# Patient Record
Sex: Female | Born: 1940 | Race: White | Hispanic: No | Marital: Married | Smoking: Never smoker
Health system: Southern US, Community
[De-identification: ages and names within clinical notes are randomized; demographics above are authoritative.]

## PROBLEM LIST (undated history)

## (undated) DIAGNOSIS — K635 Polyp of colon: Secondary | ICD-10-CM

## (undated) DIAGNOSIS — A048 Other specified bacterial intestinal infections: Secondary | ICD-10-CM

## (undated) DIAGNOSIS — K5790 Diverticulosis of intestine, part unspecified, without perforation or abscess without bleeding: Secondary | ICD-10-CM

## (undated) DIAGNOSIS — D649 Anemia, unspecified: Secondary | ICD-10-CM

## (undated) DIAGNOSIS — J189 Pneumonia, unspecified organism: Secondary | ICD-10-CM

## (undated) DIAGNOSIS — I10 Essential (primary) hypertension: Secondary | ICD-10-CM

## (undated) HISTORY — PX: FINGER SURGERY: SHX640

## (undated) HISTORY — DX: Pneumonia, unspecified organism: J18.9

## (undated) HISTORY — PX: CATARACT EXTRACTION: SUR2

## (undated) HISTORY — PX: CORNEAL TRANSPLANT: SHX108

## (undated) HISTORY — DX: Anemia, unspecified: D64.9

## (undated) HISTORY — DX: Polyp of colon: K63.5

## (undated) HISTORY — DX: Other specified bacterial intestinal infections: A04.8

## (undated) HISTORY — DX: Diverticulosis of intestine, part unspecified, without perforation or abscess without bleeding: K57.90

---

## 2012-06-19 HISTORY — PX: GALLBLADDER SURGERY: SHX652

## 2014-06-19 DIAGNOSIS — K635 Polyp of colon: Secondary | ICD-10-CM

## 2014-06-19 HISTORY — DX: Polyp of colon: K63.5

## 2016-06-06 ENCOUNTER — Encounter: Payer: Self-pay | Admitting: Physician Assistant

## 2016-06-22 ENCOUNTER — Ambulatory Visit (INDEPENDENT_AMBULATORY_CARE_PROVIDER_SITE_OTHER): Payer: Medicare Other | Admitting: Physician Assistant

## 2016-06-22 ENCOUNTER — Encounter: Payer: Self-pay | Admitting: Physician Assistant

## 2016-06-22 ENCOUNTER — Encounter (INDEPENDENT_AMBULATORY_CARE_PROVIDER_SITE_OTHER): Payer: Self-pay

## 2016-06-22 VITALS — BP 128/84 | HR 74 | Ht 61.0 in | Wt 152.0 lb

## 2016-06-22 DIAGNOSIS — R140 Abdominal distension (gaseous): Secondary | ICD-10-CM

## 2016-06-22 DIAGNOSIS — R1084 Generalized abdominal pain: Secondary | ICD-10-CM

## 2016-06-22 DIAGNOSIS — R635 Abnormal weight gain: Secondary | ICD-10-CM

## 2016-06-22 DIAGNOSIS — R152 Fecal urgency: Secondary | ICD-10-CM | POA: Diagnosis not present

## 2016-06-22 DIAGNOSIS — R194 Change in bowel habit: Secondary | ICD-10-CM | POA: Diagnosis not present

## 2016-06-22 MED ORDER — DICYCLOMINE HCL 10 MG PO CAPS: ORAL_CAPSULE | ORAL | 0 refills | Status: AC

## 2016-06-22 NOTE — Patient Instructions (Addendum)
We sent a prescription for Bentyl 10 mg to your pharmacy. [facility], [address]  1. Bentyl ( Dicyclomine) 10 mg.   You have been scheduled for a CT scan of the abdomen and pelvis at [facility] ([address]---this is in the same building as Press photographer).   You are scheduled on 06-28-2016 at 1:00 PM. You should arrive at 12:45 PM to your appointment time for registration. Please follow the written instructions below on the day of your exam:  WARNING: IF YOU ARE ALLERGIC TO IODINE/X-RAY DYE, PLEASE NOTIFY RADIOLOGY IMMEDIATELY AT [phone]! YOU WILL BE GIVEN A 13 HOUR PREMEDICATION PREP.  1) Do not eat or drink anything after 9:00 am (4 hours prior to your test) 2) You have been given 2 bottles of oral contrast to drink. The solution may taste  better if refrigerated, but do NOT add ice or any other liquid to this solution. Shake well before drinking.    Drink 1 bottle of contrast @ 11:00 am (2 hours prior to your exam)  Drink 1 bottle of contrast @ 12:00 Noon (1 hour prior to your exam)  You may take any medications as prescribed with a small amount of water except for the following: Metformin, Glucophage, Glucovance, Avandamet, Riomet, Fortamet, Actoplus Met, Janumet, Glumetza or Metaglip. The above medications must be held the day of the exam AND 48 hours after the exam.  The purpose of you drinking the oral contrast is to aid in the visualization of your intestinal tract. The contrast solution may cause some diarrhea. Before your exam is started, you will be given a small amount of fluid to drink. Depending on your individual set of symptoms, you may also receive an intravenous injection of x-ray contrast/dye. Plan on being at [facility] for 30 minutes or long, depending on the type of exam you are having performed.  If you have any questions regarding your exam or if you need to reschedule, you may call the CT department at [phone]  between the hours of 8:00 am and 5:00 pm, Monday-Friday.  ________________________________________________________________________

## 2016-06-22 NOTE — Progress Notes (Addendum)
Subjective:    Patient ID: Sharon Hale, female    DOB: [birth year], 76 y.o.   MRN: [id number]  HPI Sharon Hale is a pleasant 76 year old white female, new to GI today referred by Toy Baker NP/ [facility] regional physicians family medicine for evaluation of complaints of abdominal pain cramping and urgency for bowel movements. Patient is status post cholecystectomy, C-section 2, has history of diverticulosis and prior colon polyps. She relates previous GI evaluations done in [facility] through [facility]. She last had colonoscopy in February 2016. This report is visible in care everywhere and showed left colon diverticulosis and one 4 mm polyp was removed from the ascending colon. I cannot find a path report but patient has with her path report from a prior colonoscopy showing history of adenomatous colon polyps in 2009. She also relates that she had previous testing a few years back , with breast test breath testing to rule out small intestinal bacterial overgrowth and fructose intolerance. These were negative Patient is also concerned about a weight gain of approximately 10 pounds over the past year. She says her appetite has been very good. Her current set GI symptoms started about 2 months ago. She had gone to her primary care had blood work done which included did testing for H. pylori which was positive. She did receive a 2 week treatment course, and also started a probiotic. Continued to have complaints of rather generalized abdominal bloating and discomfort as well as intermittent abdominal cramping. She says she tends to have bouts of constipation which she's had for a couple of years and then occasional loose to normal stools. She says over the past couple of months she's been having urgency for bowel movements particularly in the morning and may have 3-4 separate bowel movements passing soft stools. She has not noted any melena or hematochezia. Despite having frequent bowel movements she says  she always feels bloated.  No recent new medications or supplements etc. She did not feel particularly any different after being treated for H. pylori.  Review of Systems .Pertinent positive and negative review of systems were noted in the above HPI section.  All other review of systems was otherwise negative.  Outpatient Encounter Prescriptions as of 06/22/2016  Medication Sig  . aspirin EC 81 MG tablet Take 81 mg by mouth daily.  . lansoprazole (PREVACID) 30 MG capsule Take 30 mg by mouth 2 (two) times daily before a meal.  . Multiple Vitamin (MULTIVITAMIN) tablet Take 1 tablet by mouth daily.  . Probiotic Product (DIGESTIVE ADVANTAGE PO) Take by mouth.  . dicyclomine (BENTYL) 10 MG capsule Take 1 tab twice daily as needed for abdominal cramping and urgency.   No facility-administered encounter medications on file as of 06/22/2016.    Allergies  Allergen Reactions  . Penicillins    There are no active problems to display for this patient.  Social History   Social History  . Marital status: Married    Spouse name: N/A  . Number of children: N/A  . Years of education: N/A   Occupational History  . retired Runner, broadcasting/film/video    Social History Main Topics  . Smoking status: Never Smoker  . Smokeless tobacco: Never Used  . Alcohol use No  . Drug use: No  . Sexual activity: Not on file   Other Topics Concern  . Not on file   Social History Narrative  . No narrative on file    Sharon Hale's family history includes Heart failure in her  father.      Objective:    Vitals:   06/22/16 0921  BP: 128/84  Pulse: 74    Physical Exam  well-developed older white female in no acute distress, pleasant blood pressure 128/84 pulse 74, Height 5 foot 1, weight 152, BMI 28.7. HEENT; nontraumatic normocephalic EOMI PERRLA sclera anicteric, Cardiovascular ;regular rate and rhythm with S1-S2 no murmur or gallop, Pulmonary ;clear bilaterally, Abdomen ;soft nondistended no palpable mass or  hepatosplenomegaly there is no focal tenderness no guarding or rebound bowel sounds are present, Rectal ;exam not done, Ext;no clubbing cyanosis or edema skin warm and dry, Neuropsych; mood and affect appropriate       Assessment & Plan:   #451 76 year old white female with 2-3 month history of persistent abdominal bloating intermittent abdominal cramping and discomfort with frequent urgency for bowel movements. Previous colonoscopy done February 2016 [facility] with finding of one 4 mm: Polyp and left colon diverticulosis. 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 Patient has  signed a release so we would may retain [facility] Patient will be established with Dr. Myrtie Neitheranis.  Addendum; [facility]Dr. Benjie KarvonenSeth Kaplan; breath testing for SIBO negative February 2016, breath testing for fructose malabsorption not interpretable was to be repeated.  Colonoscopy February 2016/Dr. Benjie KarvonenSeth Kaplan one 4 mm sessile polyp in the ascending colon, internal hemorrhoids normal mucosa in the entire colon and terminal ileum, diverticulosis sigmoid to descending colon. Path  showed normal terminal ileum, right colon no chronic or active colitis ,ascending colon 2 fragments of an adenomatous polyp no high-grade dysplasia ,and left colon normal colonic  mucosa        Bartow Zylstra S Manette Doto PA-C 06/22/2016   Cc: No ref. provider found   Thank you for sending this case to me. I have reviewed the entire note, and the outlined plan seems appropriate.

## 2016-06-27 ENCOUNTER — Other Ambulatory Visit: Payer: Medicare Other

## 2016-06-28 ENCOUNTER — Inpatient Hospital Stay: Admission: RE | Admit: 2016-06-28 | Payer: Medicare Other | Source: Ambulatory Visit

## 2016-06-30 ENCOUNTER — Ambulatory Visit (INDEPENDENT_AMBULATORY_CARE_PROVIDER_SITE_OTHER)
Admission: RE | Admit: 2016-06-30 | Discharge: 2016-06-30 | Disposition: A | Discharge status: Home / Self Care | Payer: Medicare Other | Source: Ambulatory Visit | Attending: Physician Assistant | Admitting: Physician Assistant

## 2016-06-30 DIAGNOSIS — R140 Abdominal distension (gaseous): Secondary | ICD-10-CM

## 2016-06-30 DIAGNOSIS — R1084 Generalized abdominal pain: Secondary | ICD-10-CM

## 2016-06-30 DIAGNOSIS — R635 Abnormal weight gain: Secondary | ICD-10-CM

## 2016-06-30 DIAGNOSIS — R194 Change in bowel habit: Secondary | ICD-10-CM

## 2016-06-30 DIAGNOSIS — R152 Fecal urgency: Secondary | ICD-10-CM

## 2016-06-30 MED ORDER — IOPAMIDOL (ISOVUE-300) INJECTION 61%
100.0000 mL | Freq: Once | INTRAVENOUS | Status: AC | PRN
  Administered 2016-06-30: 100 mL via INTRAVENOUS

## 2016-07-10 ENCOUNTER — Telehealth: Payer: Self-pay | Admitting: Physician Assistant

## 2016-07-10 DIAGNOSIS — R948 Abnormal results of function studies of other organs and systems: Secondary | ICD-10-CM

## 2016-07-10 DIAGNOSIS — R9389 Abnormal findings on diagnostic imaging of other specified body structures: Secondary | ICD-10-CM

## 2016-07-10 NOTE — Telephone Encounter (Signed)
You [facility] been [facility] an [facility] on 07[facility]. Your appointment time is 10 . Please arrive 945 am minutes prior to your appointment time for registration purposes. Please make certain not to have anything to eat or drink 4 hours prior to your test. In addition, if you have any metal in your body, have a pacemaker or defibrillator, please be sure to let your ordering physician know. This test typically takes 45 minutes to 1 hour to complete.  Bring card for corneal transplant  Pt has been instructed and verbalized understanding, she was given the number

## 2016-07-11 ENCOUNTER — Encounter: Payer: Self-pay | Admitting: Physician Assistant

## 2016-07-18 ENCOUNTER — Ambulatory Visit (HOSPITAL_COMMUNITY)
Admission: RE | Admit: 2016-07-18 | Discharge: 2016-07-18 | Disposition: A | Discharge status: Home / Self Care | Payer: Medicare Other | Source: Ambulatory Visit | Attending: Physician Assistant | Admitting: Physician Assistant

## 2016-07-18 ENCOUNTER — Other Ambulatory Visit: Payer: Self-pay | Admitting: Physician Assistant

## 2016-07-18 DIAGNOSIS — R9389 Abnormal findings on diagnostic imaging of other specified body structures: Secondary | ICD-10-CM

## 2016-07-18 DIAGNOSIS — R948 Abnormal results of function studies of other organs and systems: Secondary | ICD-10-CM | POA: Diagnosis not present

## 2016-07-18 LAB — POCT I-STAT CREATININE: CREATININE: 0.6 mg/dL (ref 0.44–1.00)

## 2016-07-18 MED ORDER — GADOBENATE DIMEGLUMINE 529 MG/ML IV SOLN
15.0000 mL | Freq: Once | INTRAVENOUS | Status: AC | PRN
  Administered 2016-07-18: 15 mL via INTRAVENOUS

## 2016-09-26 ENCOUNTER — Emergency Department (HOSPITAL_COMMUNITY)
Admission: EM | Admit: 2016-09-26 | Discharge: 2016-09-27 | Disposition: A | Discharge status: Home / Self Care | Payer: Medicare Other | Attending: Emergency Medicine | Admitting: Emergency Medicine

## 2016-09-26 ENCOUNTER — Encounter (HOSPITAL_COMMUNITY): Payer: Self-pay

## 2016-09-26 DIAGNOSIS — Z79899 Other long term (current) drug therapy: Secondary | ICD-10-CM | POA: Diagnosis not present

## 2016-09-26 DIAGNOSIS — R1030 Lower abdominal pain, unspecified: Secondary | ICD-10-CM | POA: Insufficient documentation

## 2016-09-26 DIAGNOSIS — Z7982 Long term (current) use of aspirin: Secondary | ICD-10-CM | POA: Diagnosis not present

## 2016-09-26 DIAGNOSIS — I10 Essential (primary) hypertension: Secondary | ICD-10-CM | POA: Insufficient documentation

## 2016-09-26 DIAGNOSIS — Z8719 Personal history of other diseases of the digestive system: Secondary | ICD-10-CM

## 2016-09-26 HISTORY — DX: Essential (primary) hypertension: I10

## 2016-09-26 LAB — URINALYSIS, ROUTINE W REFLEX MICROSCOPIC
BILIRUBIN URINE: NEGATIVE
GLUCOSE, UA: NEGATIVE mg/dL
Hgb urine dipstick: NEGATIVE
KETONES UR: NEGATIVE mg/dL
LEUKOCYTES UA: NEGATIVE
NITRITE: NEGATIVE
PH: 6 (ref 5.0–8.0)
Protein, ur: NEGATIVE mg/dL
Specific Gravity, Urine: 1.017 (ref 1.005–1.030)

## 2016-09-26 LAB — CBC
HEMATOCRIT: 41.2 % (ref 36.0–46.0)
HEMOGLOBIN: 13.5 g/dL (ref 12.0–15.0)
MCH: 30.3 pg (ref 26.0–34.0)
MCHC: 32.8 g/dL (ref 30.0–36.0)
MCV: 92.6 fL (ref 78.0–100.0)
Platelets: 339 10*3/uL (ref 150–400)
RBC: 4.45 MIL/uL (ref 3.87–5.11)
RDW: 14.4 % (ref 11.5–15.5)
WBC: 12 10*3/uL — ABNORMAL HIGH (ref 4.0–10.5)

## 2016-09-26 LAB — COMPREHENSIVE METABOLIC PANEL
ALK PHOS: 111 U/L (ref 38–126)
ALT: 25 U/L (ref 14–54)
ANION GAP: 9 (ref 5–15)
AST: 34 U/L (ref 15–41)
Albumin: 3.9 g/dL (ref 3.5–5.0)
BILIRUBIN TOTAL: 1 mg/dL (ref 0.3–1.2)
BUN: 21 mg/dL — ABNORMAL HIGH (ref 6–20)
CALCIUM: 9.4 mg/dL (ref 8.9–10.3)
CO2: 26 mmol/L (ref 22–32)
CREATININE: 0.65 mg/dL (ref 0.44–1.00)
Chloride: 103 mmol/L (ref 101–111)
Glucose, Bld: 104 mg/dL — ABNORMAL HIGH (ref 65–99)
Potassium: 4.2 mmol/L (ref 3.5–5.1)
SODIUM: 138 mmol/L (ref 135–145)
TOTAL PROTEIN: 7.2 g/dL (ref 6.5–8.1)

## 2016-09-26 LAB — LIPASE, BLOOD: Lipase: 16 U/L (ref 11–51)

## 2016-09-26 NOTE — ED Triage Notes (Signed)
Pt states she started having abdominal cramps this morning; pt denies any other issues; pt has hx of diverticulosis; pt states pain at 7/10 on arrival. No n/v; pt a&ox 4 on arrival.

## 2016-09-27 ENCOUNTER — Emergency Department (HOSPITAL_COMMUNITY): Payer: Medicare Other

## 2016-09-27 MED ORDER — METRONIDAZOLE 500 MG PO TABS: 500.0000 mg | ORAL_TABLET | Freq: Three times a day (TID) | ORAL | 0 refills | Status: AC

## 2016-09-27 MED ORDER — CIPROFLOXACIN HCL 500 MG PO TABS: 500.0000 mg | ORAL_TABLET | Freq: Two times a day (BID) | ORAL | 0 refills | Status: AC

## 2016-09-27 NOTE — ED Provider Notes (Signed)
[facility]-EMERGENCY DEPT Provider Note   CSN: [id number] Arrival date & time: 09/26/16  1927     History   Chief Complaint Chief Complaint  Patient presents with  . Abdominal Pain  . Abdominal Cramping    HPI Delorise Hunkele is a 76 y.o. female.  Patient presents with complaint of abdominal pain in the lower abdomen that started earlier today. No fever, diarrhea, hematochezia, nausea or vomiting. She was seen at Urgent Care who advised patient to come to the ED for further evaluation, possible CT scan to r/o recurrent diverticulitis. Last episode was "years ago". No urinary symptoms. Since arrival to the emergency room her pain has resolved.    The history is provided by the patient. No language interpreter was used.  Abdominal Pain   Pertinent negatives include fever, nausea, vomiting and myalgias.  Abdominal Cramping  Associated symptoms include abdominal pain. Pertinent negatives include no chest pain and no shortness of breath.    Past Medical History:  Diagnosis Date  . Colon polyps 2016  . Diverticulosis   . H. pylori infection   . Hypertension     There are no active problems to display for this patient.   Past Surgical History:  Procedure Laterality Date  . CATARACT EXTRACTION    . CESAREAN SECTION  1980  . CESAREAN SECTION  1985  . CORNEAL TRANSPLANT    . FINGER SURGERY    . GALLBLADDER SURGERY  2014    OB History    No data available       Home Medications    Prior to Admission medications   Medication Sig Start Date End Date Taking? Authorizing Provider  aspirin EC 81 MG tablet Take 81 mg by mouth daily.   Yes Historical Provider, MD  Multiple Vitamin (MULTIVITAMIN) tablet Take 1 tablet by mouth daily.   Yes Historical Provider, MD  prednisoLONE acetate (PRED FORTE) 1 % ophthalmic suspension Place 1 drop into both eyes daily. 09/04/16  Yes Historical Provider, MD  Probiotic Product (DIGESTIVE ADVANTAGE PO) Take 1 tablet by mouth daily.    Yes  Historical Provider, MD  dicyclomine (BENTYL) 10 MG capsule Take 1 tab twice daily as needed for abdominal cramping and urgency. Patient not taking: Reported on 09/27/2016 06/22/16   Amy S Esterwood, PA-C    Family History Family History  Problem Relation Age of Onset  . Heart failure Father     Social History Social History  Substance Use Topics  . Smoking status: Never Smoker  . Smokeless tobacco: Never Used  . Alcohol use No     Allergies   Azithromycin and Penicillins   Review of Systems Review of Systems  Constitutional: Negative for chills and fever.  Respiratory: Negative.  Negative for shortness of breath.   Cardiovascular: Negative.  Negative for chest pain.  Gastrointestinal: Positive for abdominal pain. Negative for blood in stool, nausea and vomiting.  Musculoskeletal: Negative.  Negative for myalgias.  Skin: Negative.   Neurological: Negative.      Physical Exam Updated Vital Signs BP 117/72 (BP Location: Right Arm)   Pulse 72   Temp 98.6 F (37 C) (Oral)   Resp 18   Ht  (1.549 m)   Wt 67.1 kg   SpO2 95%   BMI 27.96 kg/m   Physical Exam  Constitutional: She is oriented to person, place, and time. She appears well-developed and well-nourished.  HENT:  Head: Normocephalic.  Neck: Normal range of motion. Neck supple.  Cardiovascular: Normal  rate and regular rhythm.   Pulmonary/Chest: Effort normal and breath sounds normal.  Abdominal: Soft. Bowel sounds are normal. There is no tenderness. There is no rebound and no guarding.  Musculoskeletal: Normal range of motion.  Neurological: She is alert and oriented to person, place, and time.  Skin: Skin is warm and dry. No rash noted.  Psychiatric: She has a normal mood and affect.     ED Treatments / Results  Labs (all labs ordered are listed, but only abnormal results are displayed) Labs Reviewed  COMPREHENSIVE METABOLIC PANEL - Abnormal; Notable for the following:       Result Value    Glucose, Bld 104 (*)    BUN 21 (*)    All other components within normal limits  CBC - Abnormal; Notable for the following:    WBC 12.0 (*)    All other components within normal limits  LIPASE, BLOOD  URINALYSIS, ROUTINE W REFLEX MICROSCOPIC   Results for orders placed or performed during the hospital encounter of 09/26/16  Lipase, blood  Result Value Ref Range   Lipase 16 11 - 51 U/L  Comprehensive metabolic panel  Result Value Ref Range   Sodium 138 135 - 145 mmol/L   Potassium 4.2 3.5 - 5.1 mmol/L   Chloride 103 101 - 111 mmol/L   CO2 26 22 - 32 mmol/L   Glucose, Bld 104 (H) 65 - 99 mg/dL   BUN 21 (H) 6 - 20 mg/dL   Creatinine, Ser 0.98 0.44 - 1.00 mg/dL   Calcium 9.4 8.9 - 11.9 mg/dL   Total Protein 7.2 6.5 - 8.1 g/dL   Albumin 3.9 3.5 - 5.0 g/dL   AST 34 15 - 41 U/L   ALT 25 14 - 54 U/L   Alkaline Phosphatase 111 38 - 126 U/L   Total Bilirubin 1.0 0.3 - 1.2 mg/dL   GFR calc non Af Amer >60 >60 mL/min   GFR calc Af Amer >60 >60 mL/min   Anion gap 9 5 - 15  CBC  Result Value Ref Range   WBC 12.0 (H) 4.0 - 10.5 K/uL   RBC 4.45 3.87 - 5.11 MIL/uL   Hemoglobin 13.5 12.0 - 15.0 g/dL   HCT 14.7 82.9 - 56.2 %   MCV 92.6 78.0 - 100.0 fL   MCH 30.3 26.0 - 34.0 pg   MCHC 32.8 30.0 - 36.0 g/dL   RDW 13.0 86.5 - 78.4 %   Platelets 339 150 - 400 K/uL  Urinalysis, Routine w reflex microscopic  Result Value Ref Range   Color, Urine YELLOW YELLOW   APPearance CLEAR CLEAR   Specific Gravity, Urine 1.017 1.005 - 1.030   pH 6.0 5.0 - 8.0   Glucose, UA NEGATIVE NEGATIVE mg/dL   Hgb urine dipstick NEGATIVE NEGATIVE   Bilirubin Urine NEGATIVE NEGATIVE   Ketones, ur NEGATIVE NEGATIVE mg/dL   Protein, ur NEGATIVE NEGATIVE mg/dL   Nitrite NEGATIVE NEGATIVE   Leukocytes, UA NEGATIVE NEGATIVE    EKG  EKG Interpretation None       Radiology Dg Abdomen Acute W/chest  Result Date: 09/27/2016 CLINICAL DATA:  Acute onset of generalized abdominal pain. Initial encounter. EXAM:  DG ABDOMEN ACUTE W/ 1V CHEST COMPARISON:  CT of the abdomen and pelvis performed 06/30/2016 FINDINGS: The lungs are well-aerated. Mild vascular congestion is noted. Peribronchial thickening is seen. Mild left basilar atelectasis is noted. There is no evidence of pleural effusion or pneumothorax. The cardiomediastinal silhouette is within normal limits. The  visualized bowel gas pattern is unremarkable. Scattered stool and air are seen within the colon; there is no evidence of small bowel dilatation to suggest obstruction. No free intra-abdominal air is identified on the provided upright view. No acute osseous abnormalities are seen; the sacroiliac joints are unremarkable in appearance. Left convex thoracolumbar scoliosis is noted. Clips are noted within the right upper quadrant, reflecting prior cholecystectomy. IMPRESSION: 1. Unremarkable bowel gas pattern; no free intra-abdominal air seen. Small amount of stool noted in the colon. 2. Mild vascular congestion. Peribronchial thickening. Mild left basilar atelectasis noted. 3. Left convex thoracolumbar scoliosis noted. Electronically Signed   By: Roanna Raider M.D.   On: 09/27/2016 00:54    Procedures Procedures (including critical care time)  Medications Ordered in ED Medications - No data to display   Initial Impression / Assessment and Plan / ED Course  I have reviewed the triage vital signs and the nursing notes.  Pertinent labs & imaging results that were available during my care of the patient were reviewed by me and considered in my medical decision making (see chart for details).     Patient presents with abdominal pain since this morning, resolved since arrival. Labs reassuring. Her exam is benign with nontender abdomen.   She is evaluated by Dr. Wilkie Aye who agrees with exam. Will obtain abdominal series, but anticipate discharge home on empiric treatment for mild diverticulitis.  Final Clinical Impressions(s) / ED Diagnoses   Final  diagnoses:  None   1. Abdominal pain  New Prescriptions New Prescriptions   No medications on file     Elpidio Anis, Cordelia Poche 09/27/16 0131    Shon Baton, MD 09/30/16 1815

## 2018-04-26 IMAGING — CT CT ABD-PELV W/ CM
2 of 5 series · 15 of 46 positions shown, 17 images · IV contrast ([address]OVUE 300)
Comparison: None.

CLINICAL DATA: Diffuse abdominal pain and distension. Intermittent
diarrhea and weight gain over the past 2-4 months. History of
diverticulosis.

EXAM:
CT ABDOMEN AND PELVIS WITH CONTRAST
TECHNIQUE: Multidetector CT imaging of the abdomen and pelvis was performed
using the standard protocol following bolus administration of
intravenous contrast.
CONTRAST:  100mL [id number] IOPAMIDOL ([id number]-[id number]) INJECTION 61%

[Series 2: abd/pel w · axial · 0.67mm/px · z∈[-454,-69]mm · 12 of 87 slices shown, 14 images]
[im 5/87  soft-tissue]
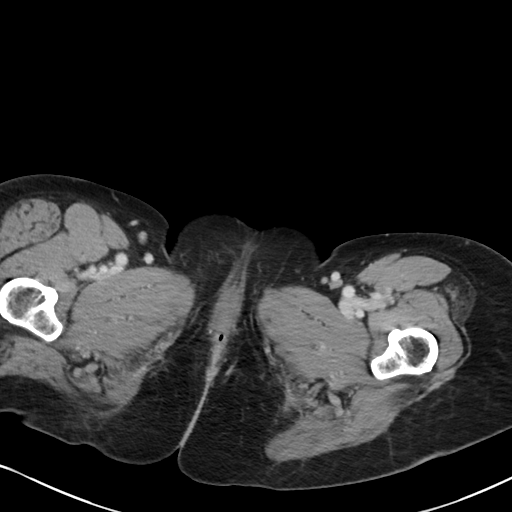
[im 5/87  bone]
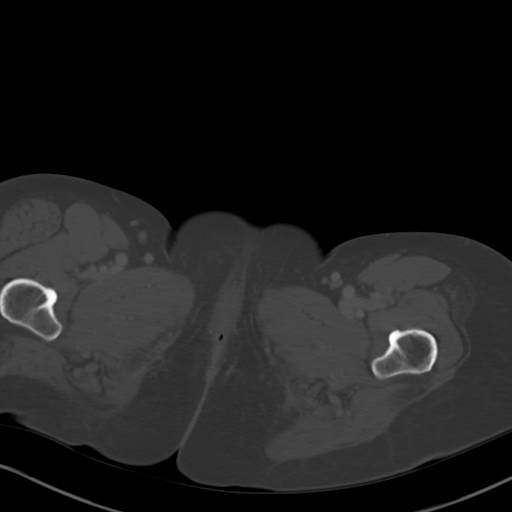
[im 14/87  soft-tissue]
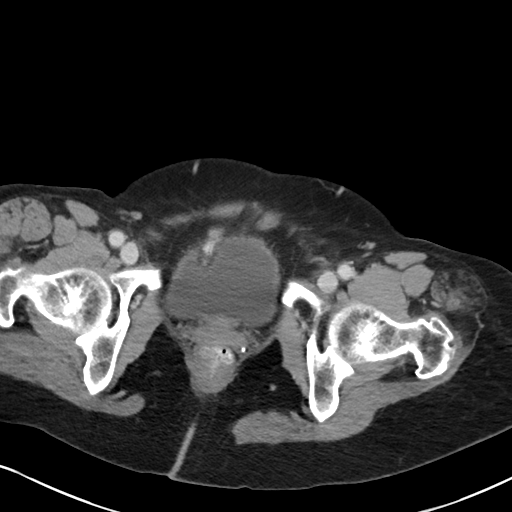
[im 19/87  soft-tissue]
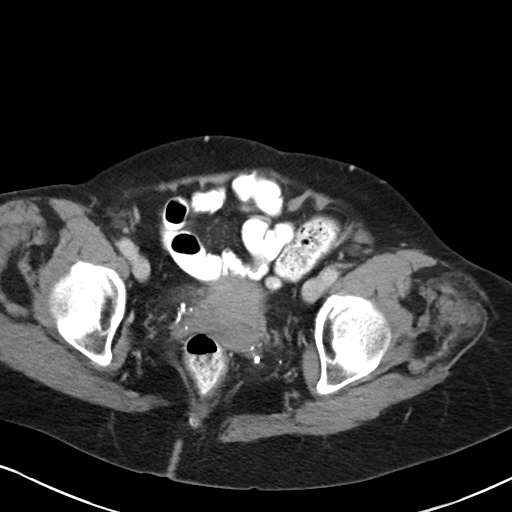
[im 28/87  soft-tissue]
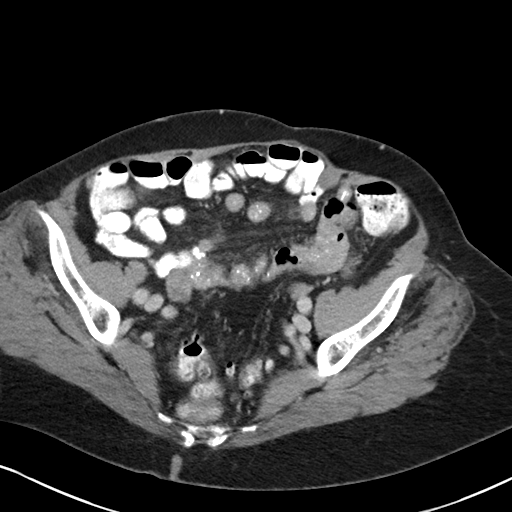
[im 32/87  soft-tissue]
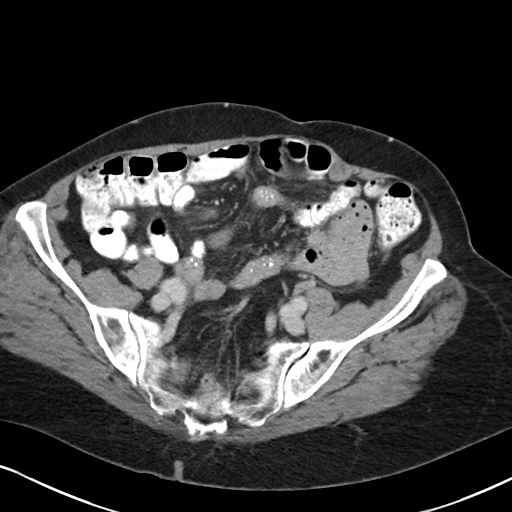
[im 41/87  soft-tissue]
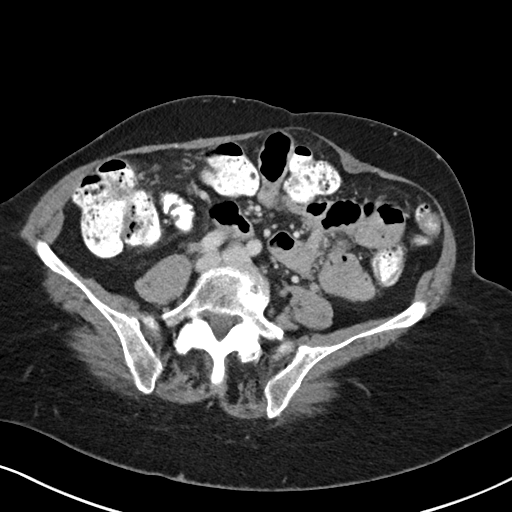
[im 46/87  soft-tissue]
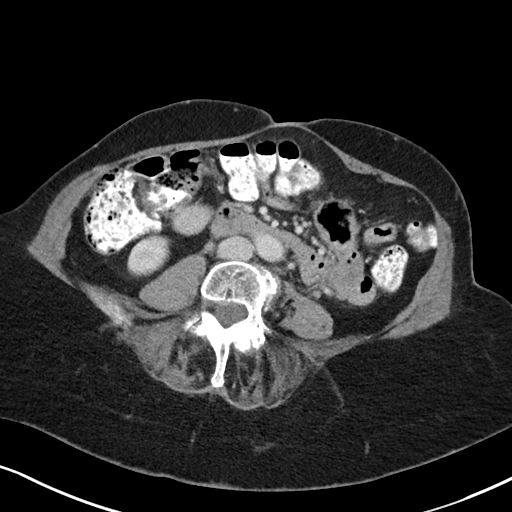
[im 55/87  soft-tissue]
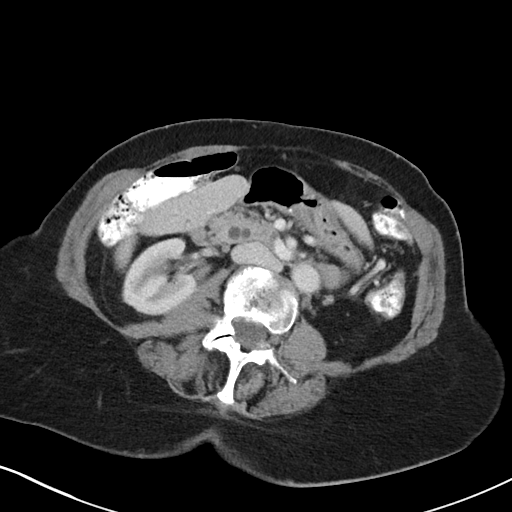
[im 59/87  soft-tissue]
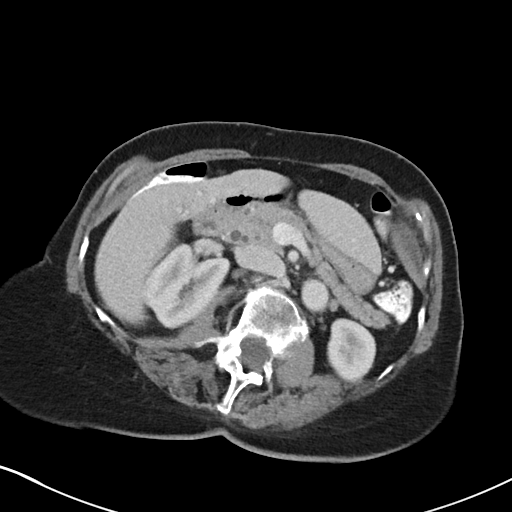
[im 59/87  bone]
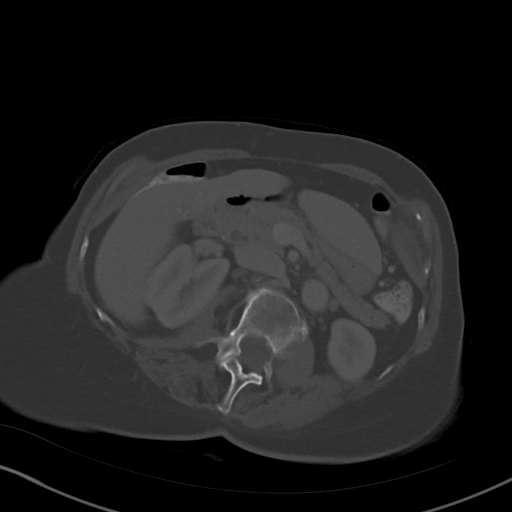
[im 68/87  soft-tissue]
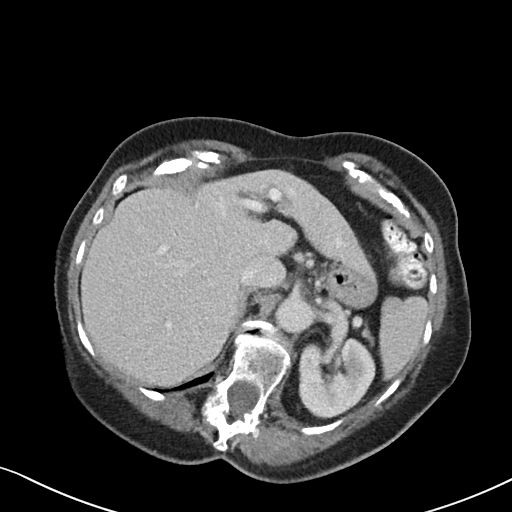
[im 73/87  soft-tissue]
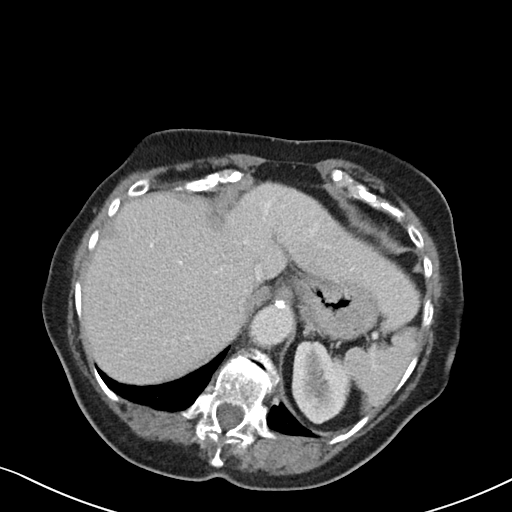
[im 82/87  soft-tissue]
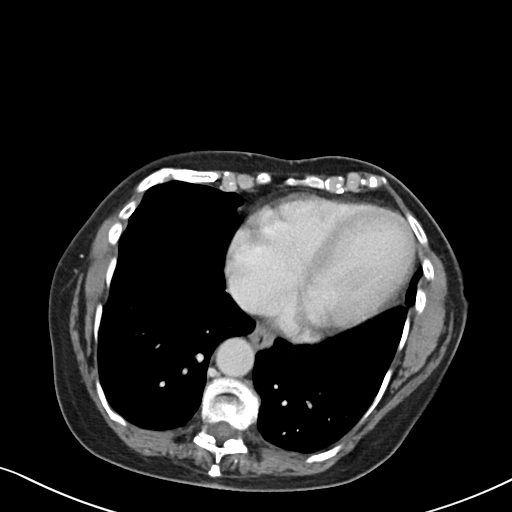

[Series 6: abd/pel w st · coronal · 0.65mm/px · 3 of 83 slices shown]
[im 28/83  soft-tissue]
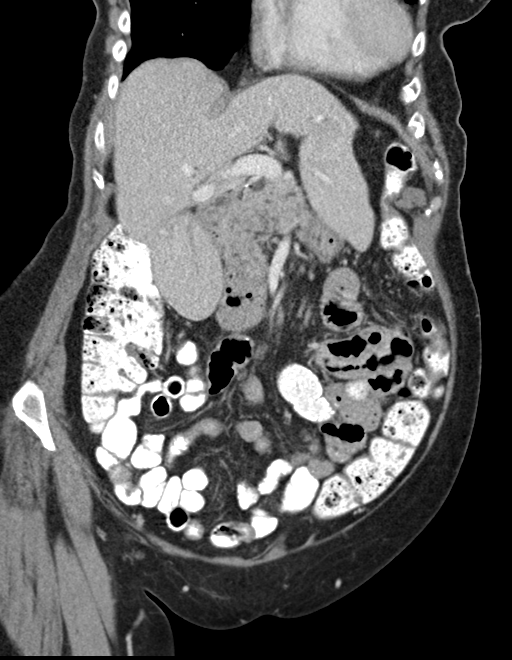
[im 37/83  soft-tissue]
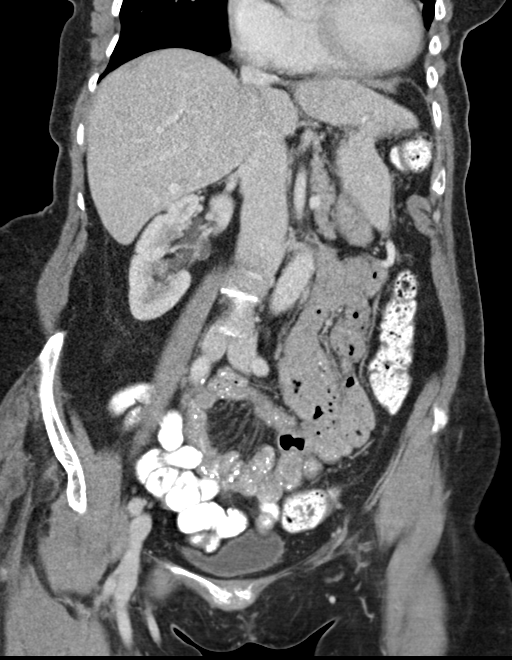
[im 46/83  soft-tissue]
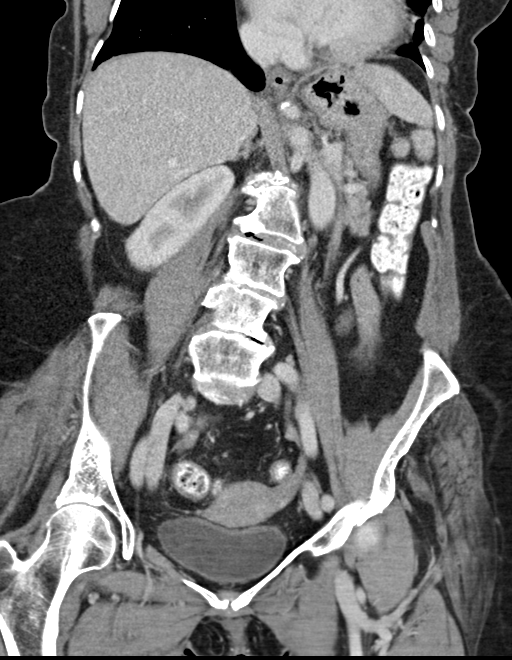

[15 of 46 positions shown; findings below may reference images not displayed]

FINDINGS: Lower chest: Mild linear atelectasis or scarring at the left lung
base.

Hepatobiliary: Small liver cysts. Mildly dilated bile ducts in the
left lobe of the liver, within normal limits for a patient has had a
cholecystectomy. Small calcified granuloma in the dome of the liver
on the right. Cholecystectomy clips.

Pancreas: Dilated pancreatic duct, most pronounced in the inferior
head of the pancreas, with a maximum diameter of 7 mm. The duct more
distally is minimally dilated multiple small, dilated duct branches
in the head of the pancreas. No visible mass. The common duct in the
head of the pancreas measures 7.3 mm in maximum diameter, within
normal limits following a cholecystectomy.

Spleen: Normal in size without focal abnormality.

Adrenals/Urinary Tract: Adrenal glands are unremarkable. Kidneys are
normal, without renal calculi, focal lesion, or hydronephrosis.
Bladder is unremarkable.

Stomach/Bowel: Diffuse proximal gastric wall thickening with a
maximum thickness of 1.5 cm. The stomach is poorly distended.
Multiple colonic diverticula. Unremarkable small bowel. No evidence
of appendicitis. Scratch of the cecum instructed superiorly. Normal
appearing appendix in the right upper abdomen.

Vascular/Lymphatic: Mild atheromatous arterial calcifications,
including the abdominal aorta. No enlarged lymph nodes.

Reproductive: Uterus and bilateral adnexa are unremarkable.

Other: None.

Musculoskeletal: Thoracolumbar spine scoliosis and degenerative
changes.
IMPRESSION: 1. Dilated pancreatic duct in branches in the head of the pancreas.
This could be due to a small, nonvisualized obstructing mass or
stone.
2. Diffuse proximal gastric wall thickening. This is most likely
artifactual due to under distention of the stomach. An infiltrate
gastric mass, symptoms lymphoma, is less likely. If this is a
clinical concern, further evaluation with upper endoscopy would be
recommended.
3. Mild aortic atherosclerosis.

## 2020-08-17 ENCOUNTER — Telehealth: Payer: Self-pay | Admitting: Physician Assistant

## 2020-08-17 NOTE — Telephone Encounter (Signed)
Hi Sharon Hale, this pt called looking to schedule her colonoscopy, her last one was in 2016 and she stated that she is due this year. Colon report is in Epic and path results are in "Care everywhere" under "Other results" tab dated 07/22/2014. Please advise if it is ok to schedule pt for direct colon. Thank you.

## 2020-08-18 NOTE — Telephone Encounter (Signed)
She hs hx of adenomatous polyps, was told 5 year follow up - Ok to schedule as direct - I established her with Dr Myrtie Neither - when I saw her in 2020

## 2020-08-19 ENCOUNTER — Telehealth: Payer: Self-pay

## 2020-08-19 NOTE — Telephone Encounter (Signed)
Yes. - HD

## 2020-08-19 NOTE — Telephone Encounter (Signed)
Pt advise of recommendation and she is requesting Dr. Christella Hartigan. A message will be sent to both MDs.

## 2020-08-19 NOTE — Telephone Encounter (Signed)
Hi Dr. Myrtie Neither, this pt would like to have her colonoscopy performed by Dr. Christella Hartigan. She was assigned to you when she saw Amy and you were the supervising physician. Are you ok releasing pt? Thank you

## 2020-08-20 NOTE — Telephone Encounter (Signed)
Dr. Christella Hartigan, I informed Sharon Hale of your recommendation and she was fine with that. I entered a recall for February 2023 to contact Sharon Hale to schedule. Thank you.

## 2020-08-20 NOTE — Telephone Encounter (Signed)
I am happy to see her however I am not sure that she really needs a colonoscopy at this point.  Guidelines have changed.  Her 2016 colonoscopy in [place] found a single subcentimeter adenomatous polyp.    She needs recall colonoscopy 7 years from the time of that one which would be February 2023.  I am happy to discuss this further with her if she prefers, in that case offer new GI office visit my next available.

## 2020-10-28 ENCOUNTER — Encounter: Payer: Medicare PPO | Admitting: Gastroenterology

## 2021-08-25 ENCOUNTER — Encounter: Payer: Self-pay | Admitting: Gastroenterology

## 2021-09-02 ENCOUNTER — Encounter: Payer: Self-pay | Admitting: Gastroenterology

## 2021-10-05 ENCOUNTER — Encounter: Payer: Self-pay | Admitting: Gastroenterology

## 2021-10-05 ENCOUNTER — Ambulatory Visit: Payer: Medicare PPO | Admitting: Gastroenterology

## 2021-10-05 VITALS — BP 132/82 | HR 76 | Resp 94 | Ht 61.0 in | Wt 123.0 lb

## 2021-10-05 DIAGNOSIS — Z86010 Personal history of colonic polyps: Secondary | ICD-10-CM

## 2021-10-05 NOTE — Patient Instructions (Signed)
If you are age 78 or older, your body mass index should be between 23-30. Your Body mass index is 23.24 kg/m?Marland Kitchen If this is out of the aforementioned range listed, please consider follow up with your Primary Care Provider. ?________________________________________________________ ? ?The [facility] providers would like to encourage you to use [facility] to communicate with providers for non-urgent requests or questions.  Due to long hold times on the telephone, sending your provider a message by [facility] may be a faster and more efficient way to get a response.  Please allow 48 business hours for a response.  Please remember that this is for non-urgent requests.  ?_______________________________________________________ ? ?You have been scheduled for a colonoscopy. Please follow written instructions given to you at your visit today.  ?Please pick up your prep supplies at the pharmacy within the next 1-3 days. ?If you use inhalers (even only as needed), please bring them with you on the day of your procedure. ? ?Due to recent changes in healthcare laws, you may see the results of your imaging and laboratory studies on MyChart before your provider has had a chance to review them.  We understand that in some cases there may be results that are confusing or concerning to you. Not all laboratory results come back in the same time frame and the provider may be waiting for multiple results in order to interpret others.  Please give [place] 48 hours in order for your provider to thoroughly review all the results before contacting the office for clarification of your results.  ? ?Thank you for entrusting me with your care and choosing [facility]. ? ?Dr Christella Hartigan ? ?

## 2021-10-05 NOTE — Progress Notes (Signed)
Review of pertinent gastrointestinal problems: ?1. Precancerous colon polyps: had colonoscopy in [place] in February 2016 which showed left colon diverticulosis and one 4 mm polyp was removed from the ascending colon. Path was TA without HGD.  Also a report from a prior colonoscopy showing history of adenomatous colon polyps in 2009. ? ?HPI: ?This is a very pleasant 81 year old woman who was referred to me by Ilona Sorrel, NP  to evaluate history of colon polyps.   ? ?She has mild chronic IBS-like symptoms that seem to improve when she takes fiber supplement every day. ? ?No overt GI bleeding ? ?She is bothered by mild chronic lower abdominal distention. ? ?No serious abdominal pains ? ?No family history of colon cancer ? ?Old Data Reviewed: ? ?Blood work 07/2021 CBC and complete metabolic profile were normal most ? ? ? ?Review of systems: ?Pertinent positive and negative review of systems were noted in the above HPI section. All other review negative. ? ? ?Past Medical History:  ?Diagnosis Date  ? Anemia   ? Colon polyps 2016  ? Diverticulosis   ? H. pylori infection   ? Hypertension   ? Pneumonia   ? ? ?Past Surgical History:  ?Procedure Laterality Date  ? CATARACT EXTRACTION    ? CESAREAN SECTION  1980  ? CESAREAN SECTION  1985  ? CORNEAL TRANSPLANT    ? FINGER SURGERY    ? GALLBLADDER SURGERY  2014  ? ? ?Current Outpatient Medications  ?Medication Instructions  ? aspirin EC 81 mg, Oral, Daily  ? ciprofloxacin (CIPRO) 500 mg, Oral, Every 12 hours  ? dicyclomine (BENTYL) 10 MG capsule Take 1 tab twice daily as needed for abdominal cramping and urgency.  ? metroNIDAZOLE (FLAGYL) 500 mg, Oral, 3 times daily  ? Multiple Vitamin (MULTIVITAMIN) tablet 1 tablet, Oral, Daily  ? prednisoLONE acetate (PRED FORTE) 1 % ophthalmic suspension 1 drop, Both Eyes, Daily  ? Probiotic Product (DIGESTIVE ADVANTAGE PO) 1 tablet, Oral, Daily  ? ? ?Allergies as of 10/05/2021 - Review Complete 10/05/2021  ?Allergen Reaction Noted  ?  Azithromycin Hives 05/05/2015  ? Penicillins Hives 05/05/2015  ? ? ?Family History  ?Problem Relation Age of Onset  ? Heart failure Father   ? ? ?Social History  ? ?Socioeconomic History  ? Marital status: Married  ?  Spouse name: Not on file  ? Number of children: Not on file  ? Years of education: Not on file  ? Highest education level: Not on file  ?Occupational History  ? Occupation: retired Runner, broadcasting/film/video  ?Tobacco Use  ? Smoking status: Never  ? Smokeless tobacco: Never  ?Vaping Use  ? Vaping Use: Never used  ?Substance and Sexual Activity  ? Alcohol use: No  ? Drug use: No  ? Sexual activity: Not on file  ?Other Topics Concern  ? Not on file  ?Social History Narrative  ? Not on file  ? ?Social Determinants of Health  ? ?Financial Resource Strain: Not on file  ?Food Insecurity: Not on file  ?Transportation Needs: Not on file  ?Physical Activity: Not on file  ?Stress: Not on file  ?Social Connections: Not on file  ?Intimate Partner Violence: Not on file  ? ? ? ?Physical Exam: ?BP 132/82   Pulse 76   Resp (!) 94   Ht 5\' 1"  (1.549 m)   Wt 123 lb (55.8 kg)   BMI 23.24 kg/m?  ?Constitutional: generally well-appearing ?Psychiatric: alert and oriented x3 ?Eyes: extraocular movements intact ?Mouth: oral pharynx  moist, no lesions ?Neck: supple no lymphadenopathy ?Cardiovascular: heart regular rate and rhythm ?Lungs: clear to auscultation bilaterally ?Abdomen: soft, nontender, nondistended, no obvious ascites, no peritoneal signs, normal bowel sounds ?Extremities: no lower extremity edema bilaterally ?Skin: no lesions on visible extremities ? ? ?Assessment and plan: ?81 y.o. female with history of colon polyps, chronic IBS, mild abdominal distention chronically ? ?Her parents lived to upper 37s and 41 years old.  She is in overall quite good health.  I think polyp surveillance colonoscopy is still a reasonable venture for her and she would like to proceed.   ? ?She has mild IBS and chronic mild abdominal distention.  I  suggested that she try taking a Gas-X pill with each meal for now.  She eats quite healthy food including lots of fruits and vegetables.  This certainly may be why she has mild abdominal distention chronically. ? ?I see no reason for blood tests or imaging studies prior to the colonoscopy above. ? ? ? ?Please see the "Patient Instructions" section for addition details about the plan. ? ? ?Rob Bunting, MD ?[facility] ?10/05/2021, 9:36 AM ? ?Cc: Ilona Sorrel, NP ? ?Total time on date of encounter was 45  minutes (this included time spent preparing to see the patient reviewing records; obtaining and/or reviewing separately obtained history; performing a medically appropriate exam and/or evaluation; counseling and educating the patient and family if present; ordering medications, tests or procedures if applicable; and documenting clinical information in the health record). ? ? ? ?

## 2021-10-14 ENCOUNTER — Encounter: Payer: Self-pay | Admitting: Gastroenterology

## 2021-10-14 ENCOUNTER — Ambulatory Visit (AMBULATORY_SURGERY_CENTER): Payer: Medicare PPO | Admitting: Gastroenterology

## 2021-10-14 VITALS — BP 126/71 | HR 69 | Temp 97.3°F | Resp 17 | Ht 61.0 in | Wt 123.0 lb

## 2021-10-14 DIAGNOSIS — Z86010 Personal history of colonic polyps: Secondary | ICD-10-CM

## 2021-10-14 MED ORDER — SODIUM CHLORIDE 0.9 % IV SOLN: 500.0000 mL | Freq: Once | INTRAVENOUS | Status: DC

## 2021-10-14 NOTE — Patient Instructions (Signed)
Handout provided on diverticulosis.   YOU HAD AN ENDOSCOPIC PROCEDURE TODAY AT THE [facility]:   Refer to the procedure report that was given to you for any specific questions about what was found during the examination.  If the procedure report does not answer your questions, please call your gastroenterologist to clarify.  If you requested that your care partner not be given the details of your procedure findings, then the procedure report has been included in a sealed envelope for you to review at your convenience later.  YOU SHOULD EXPECT: Some feelings of bloating in the abdomen. Passage of more gas than usual.  Walking can help get rid of the air that was put into your GI tract during the procedure and reduce the bloating. If you had a lower endoscopy (such as a colonoscopy or flexible sigmoidoscopy) you may notice spotting of blood in your stool or on the toilet paper. If you underwent a bowel prep for your procedure, you may not have a normal bowel movement for a few days.  Please Note:  You might notice some irritation and congestion in your nose or some drainage.  This is from the oxygen used during your procedure.  There is no need for concern and it should clear up in a day or so.  SYMPTOMS TO REPORT IMMEDIATELY:  Following lower endoscopy (colonoscopy or flexible sigmoidoscopy):  Excessive amounts of blood in the stool  Significant tenderness or worsening of abdominal pains  Swelling of the abdomen that is new, acute  Fever of 100F or higher  For urgent or emergent issues, a gastroenterologist can be reached at any hour by calling [phone]. Do not use MyChart messaging for urgent concerns.    DIET:  We do recommend a small meal at first, but then you may proceed to your regular diet.  Drink plenty of fluids but you should avoid alcoholic beverages for 24 hours.  ACTIVITY:  You should plan to take it easy for the rest of today and you should NOT DRIVE or use  heavy machinery until tomorrow (because of the sedation medicines used during the test).    FOLLOW UP: Our staff will call the number listed on your records 48-72 hours following your procedure to check on you and address any questions or concerns that you may have regarding the information given to you following your procedure. If we do not reach you, we will leave a message.  We will attempt to reach you two times.  During this call, we will ask if you have developed any symptoms of COVID 19. If you develop any symptoms (ie: fever, flu-like symptoms, shortness of breath, cough etc.) before then, please call [phone].  If you test positive for Covid 19 in the 2 weeks post procedure, please call and report this information to us.    If any biopsies were taken you will be contacted by phone or by letter within the next 1-3 weeks.  Please call us at [phone] if you have not heard about the biopsies in 3 weeks.    SIGNATURES/CONFIDENTIALITY: You and/or your care partner have signed paperwork which will be entered into your electronic medical record.  These signatures attest to the fact that that the information above on your After Visit Summary has been reviewed and is understood.  Full responsibility of the confidentiality of this discharge information lies with you and/or your care-partner.  

## 2021-10-14 NOTE — Progress Notes (Signed)
To Pacu, VSS. Report to [email or web address] 

## 2021-10-14 NOTE — Progress Notes (Signed)
VS by CW  Pt's states no medical or surgical changes since previsit or office visit.  

## 2021-10-14 NOTE — Op Note (Signed)
[facility] ?Patient Name: Sharon Hale ?Procedure Date: 10/14/2021 3:21 PM ?MRN: [id number] ?Endoscopist: Rachael Fee , MD ?Age: 81 ?Referring MD:  ?Date of Birth: 1941/05/24 ?Gender: Female ?Account #: [id number] ?Procedure:                Colonoscopy ?Indications:              High risk colon cancer surveillance: Personal  ?                          history of colonic polyps ?Medicines:                Monitored Anesthesia Care ?Procedure:                Pre-Anesthesia Assessment: ?                          - Prior to the procedure, a History and Physical  ?                          was performed, and patient medications and  ?                          allergies were reviewed. The patient's tolerance of  ?                          previous anesthesia was also reviewed. The risks  ?                          and benefits of the procedure and the sedation  ?                          options and risks were discussed with the patient.  ?                          All questions were answered, and informed consent  ?                          was obtained. Prior Anticoagulants: The patient has  ?                          taken no previous anticoagulant or antiplatelet  ?                          agents. ASA Grade Assessment: III. After reviewing  ?                          the risks and benefits, the patient was deemed in  ?                          satisfactory condition to undergo the procedure. ?                          After obtaining informed consent, the colonoscope  ?  was passed under direct vision. Throughout the  ?                          procedure, the patient's blood pressure, pulse, and  ?                          oxygen saturations were monitored continuously. The  ?                          Colonoscope was introduced through the anus and  ?                          advanced to the the cecum, identified by  ?                          appendiceal orifice and ileocecal  valve. The  ?                          colonoscopy was performed without difficulty. The  ?                          patient tolerated the procedure well. The quality  ?                          of the bowel preparation was good. The ileocecal  ?                          valve, appendiceal orifice, and rectum were  ?                          photographed. ?Scope In: 3:46:21 PM ?Scope Out: 4:01:25 PM ?Scope Withdrawal Time: 0 hours 8 minutes 25 seconds  ?Total Procedure Duration: 0 hours 15 minutes 4 seconds  ?Findings:                 Multiple small and large-mouthed diverticula were  ?                          found in the left colon. ?                          The exam was otherwise without abnormality on  ?                          direct and retroflexion views. ?Complications:            No immediate complications. Estimated blood loss:  ?                          None. ?Estimated Blood Loss:     Estimated blood loss: none. ?Impression:               - Diverticulosis in the left colon. ?                          - The examination was otherwise normal on direct  ?  and retroflexion views. ?                          - No polyps or cancers. ?Recommendation:           - Patient has a contact number available for  ?                          emergencies. The signs and symptoms of potential  ?                          delayed complications were discussed with the  ?                          patient. Return to normal activities tomorrow.  ?                          Written discharge instructions were provided to the  ?                          patient. ?                          - Resume previous diet. ?                          - Continue present medications. ?                          - You do not need any further colon cancer  ?                          screening tests (including stool testing). These  ?                          types of tests generally stop around age 79-80. ?Rachael Feeaniel P Razia Screws,  MD ?10/14/2021 4:03:56 PM ?This report has been signed electronically. ?

## 2021-10-14 NOTE — Progress Notes (Signed)
?  The recent H&P (dated 10/05/2021) was reviewed, the patient was examined and there is no change in the patients condition since that H&P was completed. ? ? ?Sharon Hale  10/14/2021, 3:21 PM ? ?

## 2021-10-18 ENCOUNTER — Telehealth: Payer: Self-pay

## 2021-10-18 NOTE — Telephone Encounter (Signed)
?  Follow up Call- ? ? ?  10/14/2021  ?  3:20 PM  ?Call back number  ?Post procedure Call Back phone  # [phone]  ?Permission to leave phone message Yes  ?  ? ?Patient questions: ? ?Do you have a fever, pain , or abdominal swelling? No. ?Pain Score  0 * ? ?Have you tolerated food without any problems? Yes.   ? ?Have you been able to return to your normal activities? Yes.   ? ?Do you have any questions about your discharge instructions: ?Diet   No. ?Medications  No. ?Follow up visit  No. ? ?Do you have questions or concerns about your Care? No. ? ?Actions: ?* If pain score is 4 or above: ?No action needed, pain <4. ? ? ?

## 2021-12-16 ENCOUNTER — Other Ambulatory Visit: Payer: Self-pay | Admitting: Otolaryngology

## 2021-12-16 DIAGNOSIS — H918X9 Other specified hearing loss, unspecified ear: Secondary | ICD-10-CM

## 2021-12-30 ENCOUNTER — Other Ambulatory Visit: Payer: Medicare PPO

## 2022-01-02 ENCOUNTER — Ambulatory Visit
Admission: RE | Admit: 2022-01-02 | Discharge: 2022-01-02 | Disposition: A | Discharge status: Home / Self Care | Payer: Medicare PPO | Source: Ambulatory Visit | Attending: Otolaryngology | Admitting: Otolaryngology

## 2022-01-02 DIAGNOSIS — H918X9 Other specified hearing loss, unspecified ear: Secondary | ICD-10-CM

## 2022-01-02 MED ORDER — GADOBENATE DIMEGLUMINE 529 MG/ML IV SOLN
11.0000 mL | Freq: Once | INTRAVENOUS | Status: AC | PRN
  Administered 2022-01-02: 11 mL via INTRAVENOUS

## 2023-05-31 ENCOUNTER — Ambulatory Visit (INDEPENDENT_AMBULATORY_CARE_PROVIDER_SITE_OTHER): Payer: Medicare PPO | Admitting: Otolaryngology

## 2023-05-31 ENCOUNTER — Encounter (INDEPENDENT_AMBULATORY_CARE_PROVIDER_SITE_OTHER): Payer: Self-pay

## 2023-05-31 VITALS — Ht 61.0 in | Wt 130.0 lb

## 2023-05-31 DIAGNOSIS — R42 Dizziness and giddiness: Secondary | ICD-10-CM

## 2023-05-31 DIAGNOSIS — H9042 Sensorineural hearing loss, unilateral, left ear, with unrestricted hearing on the contralateral side: Secondary | ICD-10-CM

## 2023-05-31 DIAGNOSIS — H8102 Meniere's disease, left ear: Secondary | ICD-10-CM

## 2023-05-31 DIAGNOSIS — H6123 Impacted cerumen, bilateral: Secondary | ICD-10-CM | POA: Diagnosis not present

## 2023-05-31 NOTE — Progress Notes (Signed)
Patient ID: Sharon Hale, female   DOB: Aug 10, 1940, 82 y.o.   MRN: [id number]  Follow up: Hearing loss, Meniere's disease   HPI: The patient is a 82 year old female who returns today for her follow-up evaluation. The patient was previously seen for hearing loss and right ear Meniere's disease. Her MRI scan was negative for retrocochlear lesion.  She was treated with a 1500 mg low-salt diet.  The patient returns today reporting no more vertigo over the past year.  She denies any significant change in her hearing. She denies any significant otalgia, otorrhea, or vertigo.  Exam: General: Communicates without difficulty, well nourished, no acute distress. Head: Normocephalic, no evidence injury, no tenderness, facial buttresses intact without stepoff. Eyes: PERRL, EOMI. No scleral icterus, conjunctivae clear. Neuro: CN II exam reveals vision grossly intact.  No nystagmus at any point of gaze. Ears: Auricles well formed without lesions.  Bilateral cerumen impaction.  Nose: External evaluation reveals normal support and skin without lesions.  Dorsum is intact.  Anterior rhinoscopy reveals healthy pink mucosa over anterior aspect of inferior turbinates and intact septum.  No purulence noted. Oral:  Oral cavity and oropharynx are intact, symmetric, without erythema or edema.  Mucosa is moist without lesions. Neck: Full range of motion without pain.  There is no significant lymphadenopathy.  No masses palpable.  Thyroid bed within normal limits to palpation.  Parotid glands and submandibular glands equal bilaterally without mass.  Trachea is midline. Neuro:  CN 2-12 grossly intact. Gait normal. Vestibular: No nystagmus at any point of gaze. The cerebellar examination is unremarkable.   Procedure: Bilateral cerumen disimpaction Anesthesia: None Description: Under the operating microscope, the cerumen is carefully removed with a combination of cerumen currette, alligator forceps, and suction catheters.  After the  cerumen is removed, the TMs are noted to be normal.  No mass, erythema, or lesions. The patient tolerated the procedure well.   Assessment  1.  Bilateral cerumen impaction.  After the disimpaction procedure, both tympanic membranes and middle ear spaces are noted to be normal. 2.  Left ear Mnire's disease. 3.  The patient's dizziness is currently under control. 4.  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  Her previous MRI scan was negative.  Plan  1.  Otomicroscopy with bilateral cerumen disimpaction. 2.  The patient should continue with her 1500 mg low-salt diet.  3.  The pathophysiology of vestibular dysfunction and Mnire's disease are discussed with the patient. Questions are invited and answered.  4.  The patient will return for reevaluation in 6 months, sooner if needed. 5.  The patient also reports intermittent hoarseness.  A referral to laryngologist Dr. Ashok Croon will be arranged.

## 2023-06-01 DIAGNOSIS — H6123 Impacted cerumen, bilateral: Secondary | ICD-10-CM | POA: Insufficient documentation

## 2023-06-01 DIAGNOSIS — R42 Dizziness and giddiness: Secondary | ICD-10-CM | POA: Insufficient documentation

## 2023-06-01 DIAGNOSIS — H9042 Sensorineural hearing loss, unilateral, left ear, with unrestricted hearing on the contralateral side: Secondary | ICD-10-CM | POA: Insufficient documentation

## 2023-07-09 ENCOUNTER — Ambulatory Visit (INDEPENDENT_AMBULATORY_CARE_PROVIDER_SITE_OTHER): Payer: Medicare PPO | Admitting: Otolaryngology

## 2023-07-09 ENCOUNTER — Encounter (INDEPENDENT_AMBULATORY_CARE_PROVIDER_SITE_OTHER): Payer: Self-pay | Admitting: Otolaryngology

## 2023-07-09 VITALS — BP 178/89 | HR 69 | Ht 61.0 in | Wt 133.0 lb

## 2023-07-09 DIAGNOSIS — R490 Dysphonia: Secondary | ICD-10-CM | POA: Diagnosis not present

## 2023-07-09 DIAGNOSIS — R0981 Nasal congestion: Secondary | ICD-10-CM | POA: Diagnosis not present

## 2023-07-09 DIAGNOSIS — K219 Gastro-esophageal reflux disease without esophagitis: Secondary | ICD-10-CM | POA: Diagnosis not present

## 2023-07-09 DIAGNOSIS — J383 Other diseases of vocal cords: Secondary | ICD-10-CM

## 2023-07-09 MED ORDER — FLUTICASONE PROPIONATE 50 MCG/ACT NA SUSP: 2.0000 | Freq: Two times a day (BID) | NASAL | 6 refills | Status: AC

## 2023-07-09 NOTE — Patient Instructions (Addendum)

## 2023-07-09 NOTE — Progress Notes (Addendum)
ENT CONSULT:  Reason for Consult: dysphonia x 6 months    HPI: Discussed the use of AI scribe software for clinical note transcription with the patient, who gave verbal consent to proceed.  History of Present Illness   The patient is an 74 yoF who presents with chronic voice changes that have been occurring for approximately six months. She describes an intermittent hoarseness that makes her voice difficult to understand. The patient denies any associated pain or complete loss of voice. She has a minimal history of reflux or heartburn, with no significant episodes requiring medication. She also denies any shortness of breath or history of lung disease.  She has a recent diagnosis of breast cancer and is scheduled for a lumpectomy on the right side in a couple of weeks.  The patient denies any history of smoking, tremors, neurologic conditions like strokes, and denies hx of joint arthritis. However, she has noticed some recent joint stiffness, particularly in the hands, which she attributes to years of sign language use.  Denies trouble with swallowing and denies coughing/choking on liquids or food. Denies hx of dyspnea, lung problems.   Record review  05/31/23 Office visit with Dr Suszanne Conners The patient is a 83 year old female who returns today for her follow-up evaluation. The patient was previously seen for hearing loss and right ear Meniere's disease. Her MRI scan was negative for retrocochlear lesion.  She was treated with a 1500 mg low-salt diet.  The patient returns today reporting no more vertigo over the past year.  She denies any significant change in her hearing. She denies any significant otalgia, otorrhea, or vertigo.   Assessment  1.  Bilateral cerumen impaction.  After the disimpaction procedure, both tympanic membranes and middle ear spaces are noted to be normal. 2.  Left ear Mnire's disease. 3.  The patient's dizziness is currently under control. 4.  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  Her previous MRI scan was negative.   Past Medical History:  Diagnosis Date   Anemia    Colon polyps 2016   Diverticulosis    H. pylori infection    Hypertension    Pneumonia     Past Surgical History:  Procedure Laterality Date   CATARACT EXTRACTION     CESAREAN SECTION  1980   CESAREAN SECTION  1985   CORNEAL TRANSPLANT     FINGER SURGERY     GALLBLADDER SURGERY  2014    Family History  Problem Relation Age of Onset   Heart failure Father    Colon cancer Neg Hx    Esophageal cancer Neg Hx    Stomach cancer Neg Hx    Rectal cancer Neg Hx     Social History:  reports that she has never smoked. She has never used smokeless tobacco. She reports that she does not drink alcohol and does not use drugs.  Allergies:  Allergies  Allergen Reactions   Azithromycin Hives   Penicillins Hives    Medications: I have reviewed the patient's current medications.  The PMH, PSH, Medications, Allergies, and SH were reviewed and updated.  ROS: Constitutional: Negative for fever, weight loss and weight gain. Cardiovascular: Negative for chest pain and dyspnea on exertion. Respiratory: Is not experiencing shortness of breath at rest. Gastrointestinal: Negative for nausea and vomiting. Neurological: Negative for headaches. Psychiatric: The patient is not nervous/anxious  Blood pressure (!) 178/89, pulse 69, height 5\' 1"  (1.549 m), weight 133 lb (60.3 kg), SpO2 93%.  PHYSICAL EXAM:  Exam: General: Well-developed, well-nourished Communication and Voice: breathy raspy with poor projection Respiratory Respiratory effort: Equal inspiration and expiration without stridor Cardiovascular Peripheral Vascular: Warm extremities with equal color/perfusion Eyes: No nystagmus with equal extraocular motion bilaterally Neuro/Psych/Balance: Patient oriented to person, place, and time; Appropriate mood and affect; Gait is intact with no  imbalance; Cranial nerves I-XII are intact Head and Face Inspection: Normocephalic and atraumatic without mass or lesion Palpation: Facial skeleton intact without bony stepoffs Salivary Glands: No mass or tenderness Facial Strength: Facial motility symmetric and full bilaterally ENT Pinna: External ear intact and fully developed External canal: Canal is patent with intact skin Tympanic Membrane: Clear and mobile External Nose: No scar or anatomic deformity Internal Nose: Septum is deviated to the left. No polyp, or purulence. Mucosal edema and erythema present. Thick secretions and crusting noted anterior nares b/l, and along middle meatus on the left Bilateral inferior turbinate hypertrophy.  Lips, Teeth, and gums: Mucosa and teeth intact and viable TMJ: No pain to palpation with full mobility Oral cavity/oropharynx: No erythema or exudate, no lesions present Nasopharynx: No mass or lesion with intact mucosa Hypopharynx: Intact mucosa without pooling of secretions Larynx Glottic: Full true vocal cord mobility without lesion or mass, VF atrophy and bowing  Supraglottic: Normal appearing epiglottis and AE folds Interarytenoid Space: Severe pachydermia&edema Subglottic Space: Patent without lesion or edema Neck Neck and Trachea: Midline trachea without mass or lesion Thyroid: No mass or nodularity Lymphatics: No lymphadenopathy  Procedure:  Summary of Video-Laryngeal-Stroboscopy: b/l VF atrophy A-shaped glottic gap, mucosal wave intact and symmetric, severe post-cricoid edema and pachydermia  Preoperative diagnosis: hoarseness  Postoperative diagnosis:   same  Procedure: Flexible fiberoptic laryngoscopy with stroboscopy (41324)  Surgeon: Sharon Croon, MD  Anesthesia: Topical lidocaine and Afrin  Complications: None  Condition is stable throughout exam  Indications and consent:   The patient presents to the clinic with hoarseness. All the risks, benefits, and potential  complications were reviewed with the patient preoperatively and informed verbal consent was obtained.  Procedure: The patient was seated upright in the exam chair.   Topical lidocaine and Afrin were applied to the nasal cavity. After adequate anesthesia had occurred, the flexible telescope was passed into the nasal cavity. The nasopharynx was patent without mass or lesion. The scope was passed behind the soft palate and directed toward the base of tongue. The base of tongue was visualized and was symmetric with no apparent masses or abnormal appearing tissue. There were no signs of a mass or pooling of secretions in the piriform sinuses. The supraglottic structures were normal.  The true vocal cords are mobile. The medial edges were bowed. Closure was incomplete. Periodicity present. The mucosal wave and amplitude were symmetric and intact. There is severe interarytenoid pachydermia and post cricoid edema. The mucosa appears without lesions.   The laryngoscope was then slowly withdrawn and the patient tolerated the procedure well. There were no complications or blood loss.    PROCEDURE NOTE: nasal endoscopy  Preoperative diagnosis: chronic nasal congestion symptoms  Postoperative diagnosis: same  Procedure: Diagnostic nasal endoscopy (40102)  Surgeon: Sharon Hale, M.D.  Anesthesia: Topical lidocaine and Afrin  H&P REVIEW: The patient's history and physical were reviewed today prior to procedure. All medications were reviewed and updated as well. Complications: None Condition is stable throughout exam Indications and consent: The patient presents with symptoms of chronic sinusitis not responding to previous therapies. All the risks, benefits, and potential complications were reviewed with the patient preoperatively and informed  consent was obtained. The time out was completed with confirmation of the correct procedure.   Procedure: The patient was seated upright in the clinic. Topical  lidocaine and Afrin were applied to the nasal cavity. After adequate anesthesia had occurred, the rigid nasal endoscope was passed into the nasal cavity. The nasal mucosa, turbinates, septum, and sinus drainage pathways were visualized bilaterally. This revealed minimal purulence but no significant secretions that might be cultured. There were no polyps or sites of significant inflammation. The mucosa was intact and there was small amount of crusting present on the left side. The scope was then slowly withdrawn and the patient tolerated the procedure well. There were no complications or blood loss.   Studies Reviewed: 01/02/22 MRI brain for asymmetric hearing loss and hx of Meniere's disease vertigo IMPRESSION: 1. No evidence of acute intracranial abnormality. 2. No cerebellopontine angle or internal auditory canal mass. 3. Mild chronic small vessel ischemic changes within the cerebral white matter.  Assessment/Plan: Encounter Diagnoses  Name Primary?   Dysphonia Yes   Chronic GERD    Age-related vocal fold atrophy    Glottic insufficiency    Chronic nasal congestion    Assessment and Plan    Chronic Hoarseness Chronic hoarseness for six months with intermittent voice changes. Denies pain, complete voice loss, reflux, heartburn, shortness of breath, lung disease, smoking, or neurologic conditions. Examination revealed significant throat irritation from reflux, nasal congestion, crusting, postnasal drainage, and vocal fold atrophy/glottic insufficiency. Differential also includes laryngeal spasm or tremor, but no clear evidence observed. Discussed voice therapy as first-line treatment to optimize lung support and vocal fold function. Explained potential benefits of voice therapy and reflux management. Discussed procedural options like vocal cord filler injections and thyroplasty surgery, noting higher risks due to age. Recommended scheduling voice therapy after February 24th lumpectomy to avoid  post-intubation throat irritation resulting in lack of improvement after therapy. - Refer to speech-language pathologist for voice therapy - Recommend nasal rinses with saline (e.g., neti pot or NeilMed bottle) - Prescribe Flonase for nasal congestion, with instructions to aim towards the corner of the eye and apply Vaseline to prevent epistaxis - Recommend Reflux Gourmet supplement (teaspoon after meals) - Provide dietary recommendations to manage reflux  GERD LPR - diet and lifestyle changes to minimize reflux  - trial of reflux gourmet  Chronic nasal congestion - nasal saline rinses - trial of Flonase   Follow-up - Schedule follow-up appointment after voice therapy in 3 months        Sharon Croon, MD Otolaryngology [facility] Phone: [phone] Fax: [phone]    07/09/2023, 10:59 AM

## 2023-09-20 ENCOUNTER — Ambulatory Visit (INDEPENDENT_AMBULATORY_CARE_PROVIDER_SITE_OTHER): Payer: Medicare PPO | Admitting: Otolaryngology

## 2023-11-29 ENCOUNTER — Encounter (INDEPENDENT_AMBULATORY_CARE_PROVIDER_SITE_OTHER): Payer: Self-pay | Admitting: Otolaryngology

## 2023-11-29 ENCOUNTER — Ambulatory Visit (INDEPENDENT_AMBULATORY_CARE_PROVIDER_SITE_OTHER): Payer: Medicare PPO | Admitting: Otolaryngology

## 2023-11-29 VITALS — BP 156/79 | HR 68

## 2023-11-29 DIAGNOSIS — R42 Dizziness and giddiness: Secondary | ICD-10-CM

## 2023-11-29 DIAGNOSIS — H9042 Sensorineural hearing loss, unilateral, left ear, with unrestricted hearing on the contralateral side: Secondary | ICD-10-CM

## 2023-11-29 DIAGNOSIS — H6123 Impacted cerumen, bilateral: Secondary | ICD-10-CM | POA: Diagnosis not present

## 2023-11-29 DIAGNOSIS — H8102 Meniere's disease, left ear: Secondary | ICD-10-CM | POA: Diagnosis not present

## 2023-11-29 DIAGNOSIS — H903 Sensorineural hearing loss, bilateral: Secondary | ICD-10-CM

## 2023-12-01 NOTE — Progress Notes (Signed)
 Patient ID: Sharon Hale, female   DOB: Mar 05, 1941, 83 y.o.   MRN: [id number]  Follow-up: Recurrent dizziness, Mnire's disease, hearing loss  HPI: The patient is an 83 year old female who returns today for her follow-up evaluation.  The patient was previously seen for recurrent dizziness, asymmetric left ear hearing loss, and left ear Mnire's disease.  Her previous MRI scan was negative for retrocochlear lesion.  She was treated with a 1500 mg low-salt diet.  The patient returns today reporting no recent dizziness or vertigo.  She denies any change in her hearing.  She also denies any significant tinnitus.  She has been doing well with the low-salt diet.  Exam: General: Communicates without difficulty, well nourished, no acute distress. Head: Normocephalic, no evidence injury, no tenderness, facial buttresses intact without stepoff. Face/sinus: No tenderness to palpation and percussion. Facial movement is normal and symmetric. Eyes: PERRL, EOMI. No scleral icterus, conjunctivae clear. Neuro: CN II exam reveals vision grossly intact.  No nystagmus at any point of gaze. Ears: Auricles well formed without lesions.  Bilateral cerumen impaction.  Nose: External evaluation reveals normal support and skin without lesions.  Dorsum is intact.  Anterior rhinoscopy reveals normal mucosa over anterior aspect of inferior turbinates and intact septum.  No purulence noted. Oral:  Oral cavity and oropharynx are intact, symmetric, without erythema or edema.  Mucosa is moist without lesions. Neck: Full range of motion without pain.  There is no significant lymphadenopathy.  No masses palpable.  Thyroid bed within normal limits to palpation.  Parotid glands and submandibular glands equal bilaterally without mass.  Trachea is midline. Neuro:  CN 2-12 grossly intact.   Procedure: Bilateral cerumen disimpaction Anesthesia: None Description: Under the operating microscope, the cerumen is carefully removed with a  combination of cerumen currette, alligator forceps, and suction catheters.  After the cerumen is removed, the TMs are noted to be normal.  No mass, erythema, or lesions. The patient tolerated the procedure well.    Assessment: 1.  Bilateral recurrent cerumen impaction.  After the disimpaction procedure, her ear canals, tympanic membranes, and middle ear spaces are all normal. 2.  The patient's left ear Mnire's disease is currently under control with the low-salt diet. 3.  Subjectively stable bilateral high-frequency sensorineural hearing loss, slightly worse on the left side.  Plan: 1.  Otomicroscopy with bilateral cerumen disimpaction. 2.  The physical exam findings are reviewed with the patient. 3.  The pathophysiology of Mnire's disease is discussed with the patient. 4.  Continue with the 1500 mg low-salt diet. 5.  The patient will return for reevaluation in 6 months.

## 2024-06-02 ENCOUNTER — Ambulatory Visit (INDEPENDENT_AMBULATORY_CARE_PROVIDER_SITE_OTHER): Admitting: Otolaryngology

## 2024-07-15 ENCOUNTER — Ambulatory Visit (INDEPENDENT_AMBULATORY_CARE_PROVIDER_SITE_OTHER): Admitting: Otolaryngology
